# Patient Record
Sex: Male | Born: 1970 | Race: White | Hispanic: No | Marital: Married | Smoking: Never smoker
Health system: Southern US, Community
[De-identification: ages and names within clinical notes are randomized; demographics above are authoritative.]

## PROBLEM LIST (undated history)

## (undated) DIAGNOSIS — G709 Myoneural disorder, unspecified: Secondary | ICD-10-CM

## (undated) DIAGNOSIS — G4730 Sleep apnea, unspecified: Secondary | ICD-10-CM

## (undated) DIAGNOSIS — IMO0001 Reserved for inherently not codable concepts without codable children: Secondary | ICD-10-CM

## (undated) DIAGNOSIS — R519 Headache, unspecified: Secondary | ICD-10-CM

## (undated) DIAGNOSIS — E119 Type 2 diabetes mellitus without complications: Secondary | ICD-10-CM

## (undated) DIAGNOSIS — R51 Headache: Secondary | ICD-10-CM

## (undated) HISTORY — PX: NASAL SINUS SURGERY: SHX719

## (undated) HISTORY — PX: OTHER SURGICAL HISTORY: SHX169

---

## 2015-06-27 ENCOUNTER — Other Ambulatory Visit: Payer: Self-pay | Admitting: Otolaryngology

## 2015-06-27 DIAGNOSIS — G8929 Other chronic pain: Secondary | ICD-10-CM

## 2015-06-27 DIAGNOSIS — R51 Headache: Secondary | ICD-10-CM

## 2015-06-27 DIAGNOSIS — R0981 Nasal congestion: Secondary | ICD-10-CM

## 2015-07-02 ENCOUNTER — Other Ambulatory Visit: Payer: Self-pay | Admitting: Emergency Medicine

## 2015-07-02 ENCOUNTER — Ambulatory Visit
Admission: RE | Admit: 2015-07-02 | Discharge: 2015-07-02 | Disposition: A | Discharge status: Home / Self Care | Payer: BLUE CROSS/BLUE SHIELD | Source: Ambulatory Visit | Attending: Otolaryngology | Admitting: Otolaryngology

## 2015-07-02 DIAGNOSIS — R51 Headache: Secondary | ICD-10-CM

## 2015-07-02 DIAGNOSIS — G8929 Other chronic pain: Secondary | ICD-10-CM

## 2015-07-02 DIAGNOSIS — R0981 Nasal congestion: Secondary | ICD-10-CM

## 2015-07-02 DIAGNOSIS — R519 Headache, unspecified: Secondary | ICD-10-CM

## 2015-07-08 ENCOUNTER — Other Ambulatory Visit: Payer: Self-pay | Admitting: Neurology

## 2015-07-08 ENCOUNTER — Ambulatory Visit
Admission: RE | Admit: 2015-07-08 | Discharge: 2015-07-08 | Disposition: A | Discharge status: Home / Self Care | Payer: BLUE CROSS/BLUE SHIELD | Source: Ambulatory Visit | Attending: Neurology | Admitting: Neurology

## 2015-07-08 DIAGNOSIS — M542 Cervicalgia: Secondary | ICD-10-CM

## 2015-08-02 ENCOUNTER — Encounter (HOSPITAL_BASED_OUTPATIENT_CLINIC_OR_DEPARTMENT_OTHER): Payer: Self-pay | Admitting: *Deleted

## 2015-08-02 NOTE — Progress Notes (Signed)
Bring all medications. Coming next week for BMET and EKG

## 2015-08-06 ENCOUNTER — Encounter (HOSPITAL_BASED_OUTPATIENT_CLINIC_OR_DEPARTMENT_OTHER)
Admission: RE | Admit: 2015-08-06 | Discharge: 2015-08-06 | Disposition: A | Discharge status: Home / Self Care | Payer: BLUE CROSS/BLUE SHIELD | Source: Ambulatory Visit | Attending: Otolaryngology | Admitting: Otolaryngology

## 2015-08-06 ENCOUNTER — Other Ambulatory Visit: Payer: Self-pay

## 2015-08-06 ENCOUNTER — Other Ambulatory Visit: Payer: Self-pay | Admitting: Otolaryngology

## 2015-08-06 DIAGNOSIS — R040 Epistaxis: Secondary | ICD-10-CM | POA: Diagnosis not present

## 2015-08-06 DIAGNOSIS — Z7984 Long term (current) use of oral hypoglycemic drugs: Secondary | ICD-10-CM | POA: Diagnosis not present

## 2015-08-06 DIAGNOSIS — J338 Other polyp of sinus: Secondary | ICD-10-CM | POA: Diagnosis not present

## 2015-08-06 DIAGNOSIS — J3489 Other specified disorders of nose and nasal sinuses: Secondary | ICD-10-CM | POA: Diagnosis not present

## 2015-08-06 DIAGNOSIS — Z7982 Long term (current) use of aspirin: Secondary | ICD-10-CM | POA: Diagnosis not present

## 2015-08-06 DIAGNOSIS — J343 Hypertrophy of nasal turbinates: Secondary | ICD-10-CM | POA: Diagnosis not present

## 2015-08-06 DIAGNOSIS — E1142 Type 2 diabetes mellitus with diabetic polyneuropathy: Secondary | ICD-10-CM | POA: Diagnosis not present

## 2015-08-06 DIAGNOSIS — Z79899 Other long term (current) drug therapy: Secondary | ICD-10-CM | POA: Diagnosis not present

## 2015-08-06 DIAGNOSIS — J329 Chronic sinusitis, unspecified: Secondary | ICD-10-CM | POA: Diagnosis not present

## 2015-08-06 DIAGNOSIS — J342 Deviated nasal septum: Secondary | ICD-10-CM | POA: Diagnosis present

## 2015-08-06 LAB — BASIC METABOLIC PANEL
Anion gap: 9 (ref 5–15)
BUN: 21 mg/dL — ABNORMAL HIGH (ref 6–20)
CALCIUM: 9.5 mg/dL (ref 8.9–10.3)
CO2: 27 mmol/L (ref 22–32)
CREATININE: 0.83 mg/dL (ref 0.61–1.24)
Chloride: 105 mmol/L (ref 101–111)
GFR calc non Af Amer: >60 mL/min (ref >60)
Glucose, Bld: 120 mg/dL — ABNORMAL HIGH (ref 65–99)
Potassium: 4.5 mmol/L (ref 3.5–5.1)
Sodium: 141 mmol/L (ref 135–145)

## 2015-08-06 NOTE — H&P (Signed)
PREOPERATIVE H&P  Chief Complaint: trouble breathing and recurrent sinus infections  HPI: Brent Chan is a 45 y.o. male who presents for evaluation of trouble breathing through his nose and recurrent sinus infections. He had previous sinus surgery about 8 years ago in [place]. He still has a deviated septum and moderate sinus disease in the ethmoid regions on recent CT scan despite multiple rounds of antibiotics. He's taken to the OR for septoplasty, turbinate reductions. And FESS.  Past Medical History  Diagnosis Date  . Sleep apnea     minor according to pt  . Diabetes mellitus without complication (HCC)   . Headache   . Neuromuscular disorder (HCC)     both feet neuropathy  . 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 Nasal sinus surgery    . Compund fracture      of tibia and fibia - 15 years ago   Social History   Social History  . Marital Status: Married    Spouse Name: N/A  . Number of Children: N/A  . Years of Education: N/A   Social History Main Topics  . Smoking status: Never Smoker   . Smokeless tobacco: None  . Alcohol Use: Yes     Comment: Drinks once a month  . Drug Use: No  . Sexual Activity: Not Asked   Other Topics Concern  . None   Social History Narrative  . None   History reviewed. No pertinent family history. Allergies  Allergen Reactions  . Codeine Nausea And Vomiting  . Morphine And Related Nausea And Vomiting  . Vicodin [Hydrocodone-Acetaminophen] Nausea And Vomiting   Prior to Admission medications   Medication Sig Start Date End Date Taking? Authorizing Provider  aspirin 81 MG tablet Take 81 mg by mouth daily.   Yes Historical Provider, MD  azelastine (ASTELIN) 0.1 % nasal spray Place into both nostrils 2 (two) times daily. Use in each nostril as directed   Yes Historical Provider, MD  cholecalciferol (VITAMIN D) 1000 units tablet Take 1,000 Units by mouth daily.   Yes Historical  Provider, MD  Dextromethorphan-Guaifenesin [facility] DM PO) Take by mouth.   Yes Historical Provider, MD  etodolac (LODINE) 200 MG capsule Take 200 mg by mouth 2 (two) times daily.   Yes Historical Provider, MD  fluticasone (FLONASE) 50 MCG/ACT nasal spray Place into both nostrils daily.   Yes Historical Provider, MD  gabapentin (NEURONTIN) 300 MG capsule Take 300 mg by mouth as needed.   Yes Historical Provider, MD  metFORMIN (GLUCOPHAGE) 500 MG tablet Take 500 mg by mouth 2 (two) times daily with a meal.   Yes Historical Provider, MD  methocarbamol (ROBAXIN) 500 MG tablet Take 500 mg by mouth 4 (four) times daily.   Yes Historical Provider, MD  methylphenidate (METADATE CD) 40 MG CR capsule Take 40 mg by mouth every morning.   Yes Historical Provider, MD  Multiple Vitamin (MULTIVITAMIN) tablet Take 1 tablet by mouth daily.   Yes Historical Provider, MD  vitamin E 200 UNIT capsule Take 200 Units by mouth daily.   Yes Historical Provider, MD     Positive ROS: trouble breathing and bleeding on the right side  All other systems have been reviewed and were otherwise negative with the exception of those mentioned in the HPI and as above.  Physical Exam: There were no vitals filed for this visit.  General: Alert, no acute distress Oral:  Normal oral mucosa and tonsils Nasal: Septal deviation to the right with crusting on the right side of the septum. Large turbinates. No polyps Neck: No palpable adenopathy or thyroid nodules Ear: Ear canal is clear with normal appearing TMs Cardiovascular: Regular rate and rhythm, no murmur.  Respiratory: Clear to auscultation Neurologic: Alert and oriented x 3   Assessment/Plan: septal deviation,sinus disease,turbinate hypertrophy Plan for Procedure(s): BILATERAL FUNCTIONAL ENDOSCOPIC SINUS SURGERY  NASAL SEPTOPLASTY WITH BILATERAL TURBINATE REDUCTION   Dillard Cannon, MD 08/06/2015 6:04 PM

## 2015-08-09 ENCOUNTER — Ambulatory Visit (HOSPITAL_BASED_OUTPATIENT_CLINIC_OR_DEPARTMENT_OTHER): Payer: BLUE CROSS/BLUE SHIELD | Admitting: Anesthesiology

## 2015-08-09 ENCOUNTER — Encounter (HOSPITAL_BASED_OUTPATIENT_CLINIC_OR_DEPARTMENT_OTHER): Payer: Self-pay | Admitting: *Deleted

## 2015-08-09 ENCOUNTER — Ambulatory Visit (HOSPITAL_BASED_OUTPATIENT_CLINIC_OR_DEPARTMENT_OTHER)
Admission: RE | Admit: 2015-08-09 | Discharge: 2015-08-09 | Disposition: A | Discharge status: Home / Self Care | Payer: BLUE CROSS/BLUE SHIELD | Source: Ambulatory Visit | Attending: Otolaryngology | Admitting: Otolaryngology

## 2015-08-09 ENCOUNTER — Encounter (HOSPITAL_BASED_OUTPATIENT_CLINIC_OR_DEPARTMENT_OTHER)
Admission: RE | Disposition: A | Discharge status: Home / Self Care | Payer: Self-pay | Source: Ambulatory Visit | Attending: Otolaryngology

## 2015-08-09 DIAGNOSIS — J329 Chronic sinusitis, unspecified: Secondary | ICD-10-CM | POA: Insufficient documentation

## 2015-08-09 DIAGNOSIS — R040 Epistaxis: Secondary | ICD-10-CM | POA: Insufficient documentation

## 2015-08-09 DIAGNOSIS — Z7984 Long term (current) use of oral hypoglycemic drugs: Secondary | ICD-10-CM | POA: Insufficient documentation

## 2015-08-09 DIAGNOSIS — J343 Hypertrophy of nasal turbinates: Secondary | ICD-10-CM | POA: Insufficient documentation

## 2015-08-09 DIAGNOSIS — E1142 Type 2 diabetes mellitus with diabetic polyneuropathy: Secondary | ICD-10-CM | POA: Insufficient documentation

## 2015-08-09 DIAGNOSIS — J342 Deviated nasal septum: Secondary | ICD-10-CM | POA: Insufficient documentation

## 2015-08-09 DIAGNOSIS — J3489 Other specified disorders of nose and nasal sinuses: Secondary | ICD-10-CM | POA: Insufficient documentation

## 2015-08-09 DIAGNOSIS — Z79899 Other long term (current) drug therapy: Secondary | ICD-10-CM | POA: Insufficient documentation

## 2015-08-09 DIAGNOSIS — J338 Other polyp of sinus: Secondary | ICD-10-CM | POA: Insufficient documentation

## 2015-08-09 DIAGNOSIS — Z7982 Long term (current) use of aspirin: Secondary | ICD-10-CM | POA: Insufficient documentation

## 2015-08-09 HISTORY — PX: NASAL SEPTOPLASTY W/ TURBINOPLASTY: SHX2070

## 2015-08-09 HISTORY — DX: Headache: R51

## 2015-08-09 HISTORY — DX: Sleep apnea, unspecified: G47.30

## 2015-08-09 HISTORY — DX: Type 2 diabetes mellitus without complications: E11.9

## 2015-08-09 HISTORY — PX: SINUS ENDO WITH FUSION: SHX5329

## 2015-08-09 HISTORY — DX: Myoneural disorder, unspecified: G70.9

## 2015-08-09 HISTORY — DX: Reserved for inherently not codable concepts without codable children: IMO0001

## 2015-08-09 HISTORY — DX: Headache, unspecified: R51.9

## 2015-08-09 LAB — POCT HEMOGLOBIN-HEMACUE: Hemoglobin: 14.8 g/dL (ref 13.0–17.0)

## 2015-08-09 LAB — GLUCOSE, CAPILLARY
Glucose-Capillary: 101 mg/dL — ABNORMAL HIGH (ref 65–99)
Glucose-Capillary: 156 mg/dL — ABNORMAL HIGH (ref 65–99)

## 2015-08-09 SURGERY — SURGERY, PARANASAL SINUS, ENDOSCOPIC, WITH NASAL SEPTOPLASTY, TURBINOPLASTY, AND MAXILLARY SINUSOTOMY
Anesthesia: General | Laterality: Bilateral

## 2015-08-09 MED ORDER — OXYMETAZOLINE HCL 0.05 % NA SOLN
NASAL | Status: DC | PRN
  Administered 2015-08-09: 1 via TOPICAL

## 2015-08-09 MED ORDER — SODIUM CHLORIDE 0.9 % IV SOLN
INTRAVENOUS | Status: DC | PRN
  Administered 2015-08-09: 500 mL via INTRAMUSCULAR

## 2015-08-09 MED ORDER — SODIUM CHLORIDE 0.9 % IJ SOLN
INTRAMUSCULAR | Status: DC | PRN
  Administered 2015-08-09: 3 mL

## 2015-08-09 MED ORDER — SUGAMMADEX SODIUM 200 MG/2ML IV SOLN
INTRAVENOUS | Status: AC
  Filled 2015-08-09: qty 2

## 2015-08-09 MED ORDER — PROPOFOL 10 MG/ML IV BOLUS
INTRAVENOUS | Status: DC | PRN
  Administered 2015-08-09: 180 mg via INTRAVENOUS

## 2015-08-09 MED ORDER — MUPIROCIN 2 % EX OINT
TOPICAL_OINTMENT | CUTANEOUS | Status: DC | PRN
  Administered 2015-08-09: 1 via NASAL

## 2015-08-09 MED ORDER — LIDOCAINE HCL (CARDIAC) 20 MG/ML IV SOLN
INTRAVENOUS | Status: AC
  Filled 2015-08-09: qty 5

## 2015-08-09 MED ORDER — ARTIFICIAL TEARS OP OINT
TOPICAL_OINTMENT | OPHTHALMIC | Status: AC
  Filled 2015-08-09: qty 7

## 2015-08-09 MED ORDER — FENTANYL CITRATE (PF) 100 MCG/2ML IJ SOLN
INTRAMUSCULAR | Status: AC
  Filled 2015-08-09: qty 4

## 2015-08-09 MED ORDER — HYDROMORPHONE HCL 1 MG/ML IJ SOLN
INTRAMUSCULAR | Status: AC
  Filled 2015-08-09: qty 1

## 2015-08-09 MED ORDER — PHENYLEPHRINE HCL 10 MG/ML IJ SOLN
INTRAMUSCULAR | Status: DC | PRN
  Administered 2015-08-09: 80 ug via INTRAVENOUS

## 2015-08-09 MED ORDER — KETOROLAC TROMETHAMINE 30 MG/ML IJ SOLN
INTRAMUSCULAR | Status: DC | PRN
  Administered 2015-08-09: 30 mg via INTRAVENOUS

## 2015-08-09 MED ORDER — SUGAMMADEX SODIUM 200 MG/2ML IV SOLN
INTRAVENOUS | Status: DC | PRN
  Administered 2015-08-09: 200 mg via INTRAVENOUS

## 2015-08-09 MED ORDER — SCOPOLAMINE 1 MG/3DAYS TD PT72
MEDICATED_PATCH | TRANSDERMAL | Status: AC
  Filled 2015-08-09: qty 1

## 2015-08-09 MED ORDER — MIDAZOLAM HCL 2 MG/2ML IJ SOLN
INTRAMUSCULAR | Status: AC
Start: 2015-08-09 — End: 2015-08-09
  Filled 2015-08-09: qty 2

## 2015-08-09 MED ORDER — HYDROMORPHONE HCL 1 MG/ML IJ SOLN
0.2500 mg | INTRAMUSCULAR | Status: DC | PRN
  Administered 2015-08-09 (×2): 0.5 mg via INTRAVENOUS

## 2015-08-09 MED ORDER — DEXAMETHASONE SODIUM PHOSPHATE 10 MG/ML IJ SOLN
INTRAMUSCULAR | Status: AC
  Filled 2015-08-09: qty 1

## 2015-08-09 MED ORDER — HYDROCODONE-ACETAMINOPHEN 5-325 MG PO TABS: 1.0000 | ORAL_TABLET | Freq: Four times a day (QID) | ORAL | Status: AC | PRN

## 2015-08-09 MED ORDER — MUPIROCIN 2 % EX OINT
TOPICAL_OINTMENT | CUTANEOUS | Status: AC
  Filled 2015-08-09: qty 22

## 2015-08-09 MED ORDER — ONDANSETRON HCL 4 MG/2ML IJ SOLN
INTRAMUSCULAR | Status: AC
  Filled 2015-08-09: qty 2

## 2015-08-09 MED ORDER — ROCURONIUM BROMIDE 50 MG/5ML IV SOLN
INTRAVENOUS | Status: AC
  Filled 2015-08-09: qty 1

## 2015-08-09 MED ORDER — CEPHALEXIN 500 MG PO CAPS: 500.0000 mg | ORAL_CAPSULE | Freq: Two times a day (BID) | ORAL | Status: AC

## 2015-08-09 MED ORDER — ONDANSETRON HCL 4 MG/2ML IJ SOLN
INTRAMUSCULAR | Status: DC | PRN
  Administered 2015-08-09: 4 mg via INTRAVENOUS

## 2015-08-09 MED ORDER — FENTANYL CITRATE (PF) 100 MCG/2ML IJ SOLN
50.0000 ug | INTRAMUSCULAR | Status: DC | PRN
  Administered 2015-08-09: 100 ug via INTRAVENOUS

## 2015-08-09 MED ORDER — SUCCINYLCHOLINE CHLORIDE 20 MG/ML IJ SOLN
INTRAMUSCULAR | Status: AC
  Filled 2015-08-09: qty 1

## 2015-08-09 MED ORDER — LIDOCAINE-EPINEPHRINE 1 %-1:100000 IJ SOLN
INTRAMUSCULAR | Status: AC
  Filled 2015-08-09: qty 1

## 2015-08-09 MED ORDER — LIDOCAINE HCL (CARDIAC) 20 MG/ML IV SOLN
INTRAVENOUS | Status: DC | PRN
  Administered 2015-08-09: 100 mg via INTRAVENOUS

## 2015-08-09 MED ORDER — OXYMETAZOLINE HCL 0.05 % NA SOLN
NASAL | Status: AC
  Filled 2015-08-09: qty 15

## 2015-08-09 MED ORDER — ROCURONIUM BROMIDE 100 MG/10ML IV SOLN
INTRAVENOUS | Status: DC | PRN
  Administered 2015-08-09: 50 mg via INTRAVENOUS

## 2015-08-09 MED ORDER — LACTATED RINGERS IV SOLN
INTRAVENOUS | Status: DC
  Administered 2015-08-09: 07:00:00 via INTRAVENOUS

## 2015-08-09 MED ORDER — CEFAZOLIN SODIUM-DEXTROSE 2-3 GM-% IV SOLR
2.0000 g | INTRAVENOUS | Status: AC
  Administered 2015-08-09: 2 g via INTRAVENOUS

## 2015-08-09 MED ORDER — PROPOFOL 10 MG/ML IV BOLUS
INTRAVENOUS | Status: AC
  Filled 2015-08-09: qty 40

## 2015-08-09 MED ORDER — MIDAZOLAM HCL 2 MG/2ML IJ SOLN
1.0000 mg | INTRAMUSCULAR | Status: DC | PRN
  Administered 2015-08-09: 2 mg via INTRAVENOUS

## 2015-08-09 MED ORDER — GLYCOPYRROLATE 0.2 MG/ML IJ SOLN: 0.2000 mg | Freq: Once | INTRAMUSCULAR | Status: DC | PRN

## 2015-08-09 MED ORDER — PHENYLEPHRINE 40 MCG/ML (10ML) SYRINGE FOR IV PUSH (FOR BLOOD PRESSURE SUPPORT)
PREFILLED_SYRINGE | INTRAVENOUS | Status: AC
  Filled 2015-08-09: qty 10

## 2015-08-09 MED ORDER — LIDOCAINE-EPINEPHRINE 1 %-1:100000 IJ SOLN
INTRAMUSCULAR | Status: DC | PRN
  Administered 2015-08-09: 17 mL

## 2015-08-09 MED ORDER — CEFAZOLIN SODIUM-DEXTROSE 2-3 GM-% IV SOLR
INTRAVENOUS | Status: AC
  Filled 2015-08-09: qty 50

## 2015-08-09 MED ORDER — DEXAMETHASONE SODIUM PHOSPHATE 4 MG/ML IJ SOLN
INTRAMUSCULAR | Status: DC | PRN
  Administered 2015-08-09: 10 mg via INTRAVENOUS

## 2015-08-09 MED ORDER — ONDANSETRON HCL 4 MG/2ML IJ SOLN: 4.0000 mg | Freq: Once | INTRAMUSCULAR | Status: DC | PRN

## 2015-08-09 MED ORDER — MEPERIDINE HCL 25 MG/ML IJ SOLN: 6.2500 mg | INTRAMUSCULAR | Status: DC | PRN

## 2015-08-09 MED ORDER — SCOPOLAMINE 1 MG/3DAYS TD PT72
1.0000 | MEDICATED_PATCH | Freq: Once | TRANSDERMAL | Status: AC | PRN
  Administered 2015-08-09: 1 via TRANSDERMAL

## 2015-08-09 MED ORDER — KETOROLAC TROMETHAMINE 30 MG/ML IJ SOLN
INTRAMUSCULAR | Status: AC
  Filled 2015-08-09: qty 1

## 2015-08-09 MED ORDER — METHYLPREDNISOLONE ACETATE 80 MG/ML IJ SUSP
INTRAMUSCULAR | Status: AC
  Filled 2015-08-09: qty 1

## 2015-08-09 SURGICAL SUPPLY — 77 items
ATTRACTOMAT 16X20 MAGNETIC DRP (DRAPES) IMPLANT
BLADE INF TURB ROT M4 2 5PK (BLADE) ×2 IMPLANT
BLADE INF TURB ROT M4 2MM 5PK (BLADE) ×1
BLADE RAD60 ROTATE M4 4 5PK (BLADE) IMPLANT
BLADE RAD60 ROTATE M4 4MM 5PK (BLADE)
BLADE ROTATE RAD 12 4 M4 (BLADE) IMPLANT
BLADE ROTATE RAD 12 4MM M4 (BLADE)
BLADE ROTATE RAD 40 4 M4 (BLADE) IMPLANT
BLADE ROTATE RAD 40 4MM M4 (BLADE)
BLADE ROTATE TRICUT 4MX13CM M4 (BLADE) ×1
BLADE ROTATE TRICUT 4X13 M4 (BLADE) ×2 IMPLANT
BUR HS RAD FRONTAL 3 (BURR) IMPLANT
BUR HS RAD FRONTAL 3MM (BURR)
CANISTER SUC SOCK COL 7IN (MISCELLANEOUS) ×3 IMPLANT
CANISTER SUCT 1200ML W/VALVE (MISCELLANEOUS) ×3 IMPLANT
CLEANER CAUTERY TIP 5X5 PAD (MISCELLANEOUS) IMPLANT
COAGULATOR SUCT 8FR VV (MISCELLANEOUS) ×3 IMPLANT
COVER PROBE W GEL 5X96 (DRAPES) IMPLANT
DECANTER SPIKE VIAL GLASS SM (MISCELLANEOUS) IMPLANT
DEVICE INFLATION 20/61 (MISCELLANEOUS) IMPLANT
DRAPE SURG 17X23 STRL (DRAPES) ×3 IMPLANT
DRESSING NASAL KENNEDY 3.5X.9 (MISCELLANEOUS) IMPLANT
DRSG NASAL KENNEDY 3.5X.9 (MISCELLANEOUS)
DRSG NASAL KENNEDY LMNT 8CM (GAUZE/BANDAGES/DRESSINGS) IMPLANT
DRSG NASOPORE 8CM (GAUZE/BANDAGES/DRESSINGS) ×3 IMPLANT
DRSG TELFA 3X8 NADH (GAUZE/BANDAGES/DRESSINGS) ×3 IMPLANT
ELECT COATED BLADE 2.86 ST (ELECTRODE) IMPLANT
ELECT NEEDLE BLADE 2-5/6 (NEEDLE) ×3 IMPLANT
ELECT REM PT RETURN 9FT ADLT (ELECTROSURGICAL) ×3
ELECTRODE REM PT RTRN 9FT ADLT (ELECTROSURGICAL) ×1 IMPLANT
GLOVE BIOGEL PI IND STRL 7.0 (GLOVE) ×2 IMPLANT
GLOVE BIOGEL PI IND STRL 7.5 (GLOVE) ×2 IMPLANT
GLOVE BIOGEL PI INDICATOR 7.0 (GLOVE) ×4
GLOVE BIOGEL PI INDICATOR 7.5 (GLOVE) ×4
GLOVE ECLIPSE 6.5 STRL STRAW (GLOVE) ×3 IMPLANT
GLOVE SKINSENSE NS SZ7.5 (GLOVE) ×4
GLOVE SKINSENSE STRL SZ7.5 (GLOVE) ×2 IMPLANT
GLOVE SS BIOGEL STRL SZ 7.5 (GLOVE) ×1 IMPLANT
GLOVE SUPERSENSE BIOGEL SZ 7.5 (GLOVE) ×2
GOWN STRL REUS W/ TWL LRG LVL3 (GOWN DISPOSABLE) ×2 IMPLANT
GOWN STRL REUS W/ TWL XL LVL3 (GOWN DISPOSABLE) ×3 IMPLANT
GOWN STRL REUS W/TWL LRG LVL3 (GOWN DISPOSABLE) ×4
GOWN STRL REUS W/TWL XL LVL3 (GOWN DISPOSABLE) ×6
HEMOSTAT SURGICEL .5X2 ABSORB (HEMOSTASIS) IMPLANT
HEMOSTAT SURGICEL 2X14 (HEMOSTASIS) IMPLANT
IV NS 1000ML (IV SOLUTION)
IV NS 1000ML BAXH (IV SOLUTION) IMPLANT
IV NS 500ML (IV SOLUTION) ×4
IV NS 500ML BAXH (IV SOLUTION) ×2 IMPLANT
NEEDLE PRECISIONGLIDE 27X1.5 (NEEDLE) ×3 IMPLANT
NEEDLE SPNL 25GX3.5 QUINCKE BL (NEEDLE) IMPLANT
NS IRRIG 1000ML POUR BTL (IV SOLUTION) ×3 IMPLANT
PACK BASIN DAY SURGERY FS (CUSTOM PROCEDURE TRAY) ×3 IMPLANT
PACK ENT DAY SURGERY (CUSTOM PROCEDURE TRAY) ×3 IMPLANT
PAD CLEANER CAUTERY TIP 5X5 (MISCELLANEOUS)
PATTIES SURGICAL .5 X3 (DISPOSABLE) ×3 IMPLANT
PENCIL BUTTON HOLSTER BLD 10FT (ELECTRODE) IMPLANT
SHEET SILASTIC 8X6X.030 25-30 (MISCELLANEOUS) IMPLANT
SLEEVE SCD COMPRESS KNEE MED (MISCELLANEOUS) ×3 IMPLANT
SOLUTION ANTI FOG 6CC (MISCELLANEOUS) ×3 IMPLANT
SPONGE GAUZE 2X2 8PLY STER LF (GAUZE/BANDAGES/DRESSINGS) ×1
SPONGE GAUZE 2X2 8PLY STRL LF (GAUZE/BANDAGES/DRESSINGS) ×2 IMPLANT
SUT CHROMIC 4 0 PS 2 18 (SUTURE) ×3 IMPLANT
SUT ETHILON 3 0 PS 1 (SUTURE) ×3 IMPLANT
SUT SILK 2 0 FS (SUTURE) IMPLANT
SUT VIC AB 4-0 P-3 18XBRD (SUTURE) IMPLANT
SUT VIC AB 4-0 P3 18 (SUTURE)
SYR 3ML 18GX1 1/2 (SYRINGE) IMPLANT
SYSTEM RELIEVA LUMA ILLUM (SINUPLASTY) IMPLANT
TOWEL OR 17X24 6PK STRL BLUE (TOWEL DISPOSABLE) ×6 IMPLANT
TRACKER ENT INSTRUMENT (MISCELLANEOUS) ×3 IMPLANT
TRACKER ENT PATIENT (MISCELLANEOUS) ×3 IMPLANT
TRAY DSU PREP LF (CUSTOM PROCEDURE TRAY) ×3 IMPLANT
TUBE CONNECTING 20'X1/4 (TUBING) ×1
TUBE CONNECTING 20X1/4 (TUBING) ×2 IMPLANT
TUBING STRAIGHTSHOT EPS 5PK (TUBING) IMPLANT
YANKAUER SUCT BULB TIP NO VENT (SUCTIONS) ×3 IMPLANT

## 2015-08-09 NOTE — Discharge Instructions (Addendum)
Elevate HOB and app;y cool compress to nose to reduce bleeding and swelling Tylenol, motrin or hydrocodone tabs prn pain Return to Dr Allene PyoNewman's office tomorrow at 9 am to have nasal packing removed Keflex 500 mg twice per day for 10 days Stop ASA for 5 days  Call your surgeon if you experience:   1.  Fever over 101.0. 2.  Inability to urinate. 3.  Nausea and/or vomiting. 4.  Extreme swelling or bruising at the surgical site. 5.  Continued bleeding from the incision. 6.  Increased pain, redness or drainage from the incision. 7.  Problems related to your pain medication. 8.  Any problems and/or concerns.   Post Anesthesia Home Care Instructions  Activity: Get plenty of rest for the remainder of the day. A responsible adult should stay with you for 24 hours following the procedure.  For the next 24 hours, DO NOT: -Drive a car -Advertising copywriterperate machinery -Drink alcoholic beverages -Take any medication unless instructed by your physician -Make any legal decisions or sign important papers.  Meals: Start with liquid foods such as gelatin or soup. Progress to regular foods as tolerated. Avoid greasy, spicy, heavy foods. If nausea and/or vomiting occur, drink only clear liquids until the nausea and/or vomiting subsides. Call your physician if vomiting continues.  Special Instructions/Symptoms: Your throat may feel dry or sore from the anesthesia or the breathing tube placed in your throat during surgery. If this causes discomfort, gargle with warm salt water. The discomfort should disappear within 24 hours.  If you had a scopolamine patch placed behind your ear for the management of post- operative nausea and/or vomiting:  1. The medication in the patch is effective for 72 hours, after which it should be removed.  Wrap patch in a tissue and discard in the trash. Wash hands thoroughly with soap and water. 2. You may remove the patch earlier than 72 hours if you experience unpleasant side effects  which may include dry mouth, dizziness or visual disturbances. 3. Avoid touching the patch. Wash your hands with soap and water after contact with the patch.

## 2015-08-09 NOTE — Op Note (Signed)
NAMLimmie Patricia:  Rieth, Hamed                 ACCOUNT NO.:  [id number]  MEDICAL RECORD NO.:  [id number]  LOCATION:                                 FACILITY:  PHYSICIAN:  Kristine GarbeChristopher E. Ezzard StandingNewman, M.D.DATE OF BIRTH:  10-17-1970  DATE OF PROCEDURE: DATE OF DISCHARGE:                              OPERATIVE REPORT   PREOPERATIVE DIAGNOSES:  Septal deviation with recurrent right-sided epistaxis.  Bilateral turbinate hypertrophy with nasal obstruction. Sinonasal polyps.  POSTOPERATIVE DIAGNOSES:  Septal deviation with recurrent right-sided epistaxis.  Bilateral turbinate hypertrophy with nasal obstruction. Sinonasal polyps.  OPERATION PERFORMED:  Septoplasty with bilateral inferior turbinate reductions.  Functional endoscopic sinus surgery with bilateral total ethmoidectomies.  SURGEON:  Kristine GarbeChristopher E. Ezzard StandingNewman, M.D.  ANESTHESIA:  General endotracheal.  COMPLICATIONS:  None.  BRIEF CLINICAL NOTE:  Michail Sermonodd Bambrick is a 45 year old gentleman who has had long history of chronic sinus problems.  He apparently had previous surgery about 7 or 8 years ago and no problem.  At that time, he had multiple sinonasal polyps with acute and chronic frontal sinusitis. Ever since his surgery, he has been using Afrin on a fairly regular basis and uses it on a daily basis 2 or 3 times a day.  On examination, he has rather significant septal deviation to the right.  In addition, he has a large sore on the septum on the right side that extends into the cartilage.  He has been having most of his bleeding related to this chronic sore, which is overlying the deviated portion of the septum at the junction of the cartilaginous septum and the maxillary crest.  He had a CT scan, which shows moderate ethmoid disease and little bit of the maxillary disease.  The frontal and sphenoid sinuses were actually fairly clear.  His main trouble seems to be breathing especially on the right side with frequent use of Afrin now.  He has  also had recurrent right-sided epistaxis.  He was taken to the operating room at this time for septoplasty and turbinate reductions to help improve his nasal breathing and functional endoscopic sinus surgery, removing any residual polypoid tissue from the ethmoid area.  DESCRIPTION OF PROCEDURE:  After adequate endotracheal anesthesia, the patient received 2 g of Ancef and 10 mg of Decadron IV preoperatively. The fusion machine was calibrated.  First, septoplasty was performed. Hemitransfixion incision was made along the cartilaginous septum on the right side.  Mucoperiosteal flaps were elevated posteriorly.  The patient had a bowel approximately 2 cm ulcer of the mucoperichondrium on the right side and actually extended into the cartilage. Mucoperichondrium was elevated around this ulcerative hole on the mucoperichondrium.  In the same region, some cartilage as well as the bony maxillary crest protruded into the right airway.  Mucoperichondrium and mucoperiosteal flaps were elevated in either of this and this was removed in order to return the septum more towards the midline.  In addition, mucoperichondrium and mucoperiosteal flaps were elevated posteriorly superiorly, some of the thickened cartilage in the posterior edge of the cartilaginous septum.  The area of the bone marrow was removed.  This completed the septoplasty portion of the procedure. Next, using the endoscopes and a microdebrider,  bilateral ethmoidectomies were performed.  At the end of previous ethmoid surgery, there was still some polypoid disease, the right side little bit worse than left.  This was removed with a microdebrider.  Few of the posterior ethmoid cells were still intact and these were opened up, and debris were removed with microdebrider.  On endoscopic evaluation of the maxillary ostia, these were still widely patent with minimal disease within the maxillary sinus, may be a little bit of thickened  mucous membranes, but nothing was really done to the maxillary ostia that already opened up.  Nothing needs to be done to the sphenoid area and frontal areas, these were clear on the CT scan with minimal disease in the nasofrontal duct area.  This completed the bilateral ethmoidectomies.  Next, the inferior turbinate reductions were performed using the Medtronic turbinate blade on the right side and on the left side.  The Medtronic turbinate blade was used, but in addition, some of the bony turbinate tissue was removed with scissors as the left turbinate was larger than the right because of the deviation of the septum to the right.  After performing turbinate reductions, the remaining turbinate tissue was outfractured.  Splints were secured to either side of the septum with a 3-0 nylon suture.  The middle meatus and ethmoid area were packed with NasoPore, soaked in Bactroban, and the floor of the nose and turbinate area were packed with Telfa, soaked in Bactroban.  This completed the procedure.  Britt was awoken from anesthesia and transferred to the recovery room postoperatively doing well.  DISPOSITION:  Caven was discharged home later this morning on Keflex 500 mg b.i.d. for 10 days along with Tylenol, Motrin or hydrocodone 5 mg tablets 1-2 q.6 hours p.r.n. pain.  We will have him return to my office tomorrow morning at 9 to have his nasal packs removed and will follow up in my office in 10 days to have his splints removed.          ______________________________ Kristine Garbe Ezzard Standing, M.D.     CEN/MEDQ  D:  08/09/2015  T:  08/09/2015  Job:  [id number]  cc:   Evelena Peat, M.D.

## 2015-08-09 NOTE — Interval H&P Note (Signed)
History and Physical Interval Note:  08/09/2015 7:30 AM  Brent Chan  has presented today for surgery, with the diagnosis of septal deviation,sinus disease,turbinate hypertrophy  The various methods of treatment have been discussed with the patient and family. After consideration of risks, benefits and other options for treatment, the patient has consented to  Procedure(s): BILATERAL FUNCTIONAL ENDOSCOPIC SINUS SURGERY  (Bilateral) NASAL SEPTOPLASTY WITH BILATERAL TURBINATE REDUCTION (Bilateral) as a surgical intervention .  The patient's history has been reviewed, patient examined, no change in status, stable for surgery.  I have reviewed the patient's chart and labs.  Questions were answered to the patient's satisfaction.     CHRISTOPHER NEWMAN

## 2015-08-09 NOTE — Transfer of Care (Signed)
Immediate Anesthesia Transfer of Care Note  Patient: Brent Chan  Procedure(s) Performed: Procedure(s): BILATERAL FUNCTIONAL ENDOSCOPIC SINUS SURGERY WITH FUSION NAVIGATION, BILATERAL ETHMOIDECTOMY (Bilateral) NASAL SEPTOPLASTY WITH BILATERAL TURBINATE REDUCTION (Bilateral)  Patient Location: PACU  Anesthesia Type:General  Level of Consciousness: awake, alert , oriented and patient cooperative  Airway & Oxygen Therapy: Patient Spontanous Breathing and Patient connected to face mask oxygen  Post-op Assessment: Report given to RN and Post -op Vital signs reviewed and stable  Post vital signs: Reviewed and stable  BP/106/69   93-10 SaO2 100%  Last Vitals:  Filed Vitals:   08/09/15 0637  BP: 101/67  Pulse: 76  Temp: 36.7 C  Resp: 16    Complications: No apparent anesthesia complications

## 2015-08-09 NOTE — Brief Op Note (Signed)
08/09/2015  10:10 AM  PATIENT:  Brent Chan  45 y.o. male  PRE-OPERATIVE DIAGNOSIS:  septal deviation,sinus disease,turbinate hypertrophy  POST-OPERATIVE DIAGNOSIS:  septal deviation,sinus disease,turbinate hypertrophy  PROCEDURE:  Procedure(s): BILATERAL FUNCTIONAL ENDOSCOPIC SINUS SURGERY WITH FUSION NAVIGATION, BILATERAL ETHMOIDECTOMY (Bilateral) NASAL SEPTOPLASTY WITH BILATERAL TURBINATE REDUCTION (Bilateral)  SURGEON:  Surgeon(s) and Role:    * Drema Halonhristopher E Newman, MD - Primary  PHYSICIAN ASSISTANT:   ASSISTANTS: none   ANESTHESIA:   general  EBL:  Total I/O In: 800 [I.V.:800] Out: 60 [Blood:60]  BLOOD ADMINISTERED:none  DRAINS: none   LOCAL MEDICATIONS USED:  XYLOCAINE with EPI 14 cc  SPECIMEN:  No Specimen  DISPOSITION OF SPECIMEN:  N/A  COUNTS:  YES  TOURNIQUET:  * No tourniquets in log *  DICTATION: .Other Dictation: Dictation Number [id number]  PLAN OF CARE: Discharge to home after PACU  PATIENT DISPOSITION:  PACU - hemodynamically stable.   Delay start of Pharmacological VTE agent (>24hrs) due to surgical blood loss or risk of bleeding: yes

## 2015-08-09 NOTE — Anesthesia Procedure Notes (Signed)
Procedure Name: Intubation Date/Time: 08/09/2015 7:50 AM Performed by: Tyrone NineSAUVE, Alquan Morrish F Pre-anesthesia Checklist: Patient identified, Timeout performed, Emergency Drugs available, Suction available and Patient being monitored Patient Re-evaluated:Patient Re-evaluated prior to inductionOxygen Delivery Method: Circle system utilized Preoxygenation: Pre-oxygenation with 100% oxygen Intubation Type: IV induction Ventilation: Mask ventilation without difficulty Laryngoscope Size: Mac and 3 Grade View: Grade I Tube type: Oral Rae Tube size: 7.0 mm Number of attempts: 1 Airway Equipment and Method: Stylet Placement Confirmation: ETT inserted through vocal cords under direct vision,  positive ETCO2 and breath sounds checked- equal and bilateral Secured at: 19 cm Tube secured with: Tape Dental Injury: Teeth and Oropharynx as per pre-operative assessment

## 2015-08-09 NOTE — Anesthesia Preprocedure Evaluation (Addendum)
Anesthesia Evaluation  Patient identified by MRN, date of birth, ID band Patient awake    Reviewed: Allergy & Precautions, NPO status , Patient's Chart, lab work & pertinent test results  Airway Mallampati: I  TM Distance: >3 FB Neck ROM: Full    Dental  (+) Teeth Intact, Dental Advisory Given, Caps,    Pulmonary sleep apnea ,    Pulmonary exam normal        Cardiovascular Exercise Tolerance: Good negative cardio ROS Normal cardiovascular exam Rhythm:Regular Rate:Normal  06-Aug-2015 09:26:09 EKG Normal sinus rhythm   Neuro/Psych  Headaches, negative psych ROS   GI/Hepatic negative GI ROS, Neg liver ROS,   Endo/Other  diabetes, Type 2, Oral Hypoglycemic AgentsNeuropathy in feet  Renal/GU negative Renal ROS  negative genitourinary   Musculoskeletal negative musculoskeletal ROS (+)   Abdominal   Peds  Hematology   Anesthesia Other Findings   Reproductive/Obstetrics negative OB ROS                         Anesthesia Physical Anesthesia Plan  ASA: III  Anesthesia Plan: General   Post-op Pain Management:    Induction: Intravenous  Airway Management Planned: Oral ETT  Additional Equipment:   Intra-op Plan:   Post-operative Plan: Extubation in OR  Informed Consent: I have reviewed the patients History and Physical, chart, labs and discussed the procedure including the risks, benefits and alternatives for the proposed anesthesia with the patient or authorized representative who has indicated his/her understanding and acceptance.   Dental advisory given  Plan Discussed with: CRNA, Surgeon and Anesthesiologist  Anesthesia Plan Comments:        Anesthesia Quick Evaluation

## 2015-08-09 NOTE — Anesthesia Postprocedure Evaluation (Signed)
Anesthesia Post Note  Patient: Brent Chan  Procedure(s) Performed: Procedure(s) (LRB): BILATERAL FUNCTIONAL ENDOSCOPIC SINUS SURGERY WITH FUSION NAVIGATION, BILATERAL ETHMOIDECTOMY (Bilateral) NASAL SEPTOPLASTY WITH BILATERAL TURBINATE REDUCTION (Bilateral)  Patient location during evaluation: PACU Anesthesia Type: General Level of consciousness: awake and alert Pain management: pain level controlled Vital Signs Assessment: post-procedure vital signs reviewed and stable Respiratory status: spontaneous breathing, nonlabored ventilation, respiratory function stable and patient connected to nasal cannula oxygen Cardiovascular status: blood pressure returned to baseline and stable Postop Assessment: no signs of nausea or vomiting Anesthetic complications: no    Last Vitals:  Filed Vitals:   08/09/15 1141 08/09/15 1205  BP: 111/69 106/72  Pulse: 79 80  Temp:  36.6 C  Resp: 11 16    Last Pain:  Filed Vitals:   08/09/15 1246  PainSc: 2                  Magen Suriano DAVID

## 2015-08-12 ENCOUNTER — Encounter (HOSPITAL_BASED_OUTPATIENT_CLINIC_OR_DEPARTMENT_OTHER): Payer: Self-pay | Admitting: Otolaryngology

## 2017-06-15 IMAGING — CT CT MAXILLOFACIAL W/O CM
3 series · 15 of 47 positions shown, 18 images · non-contrast
Comparison: None.

CLINICAL DATA: 44-year-old male with headaches, nasal and sinus
congestion. Initial encounter.

EXAM:
CT MAXILLOFACIAL WITHOUT CONTRAST
TECHNIQUE: Multidetector CT imaging of the maxillofacial structures was
performed. Multiplanar CT image reconstructions were also generated.
A small metallic BB was placed on the right temple in order to
reliably differentiate right from left.

[Series 3: soft tissue · axial · 0.37mm/px · z∈[+98,+242]mm · 9 of 167 slices shown, 12 images]
[im 12/167  brain]
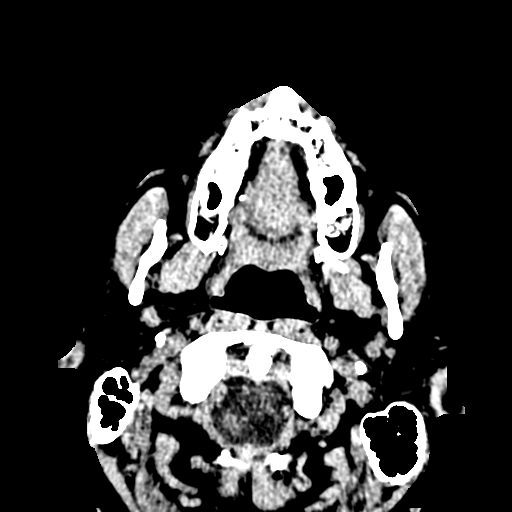
[im 12/167  bone]
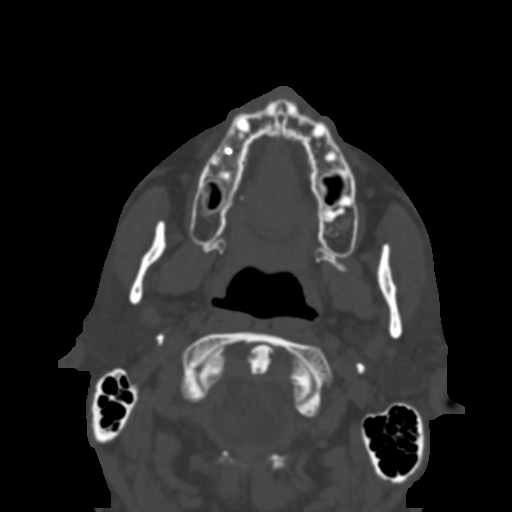
[im 29/167  bone]
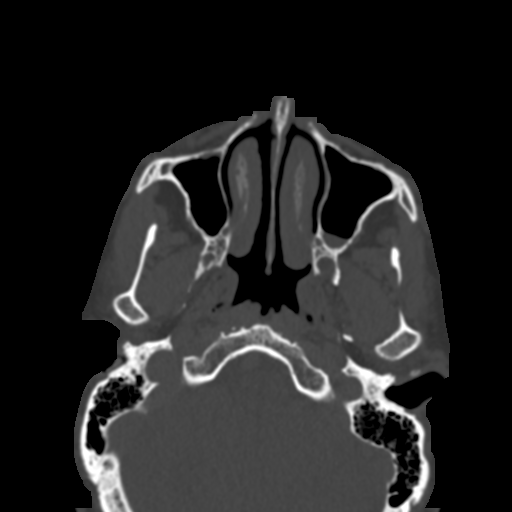
[im 46/167  bone]
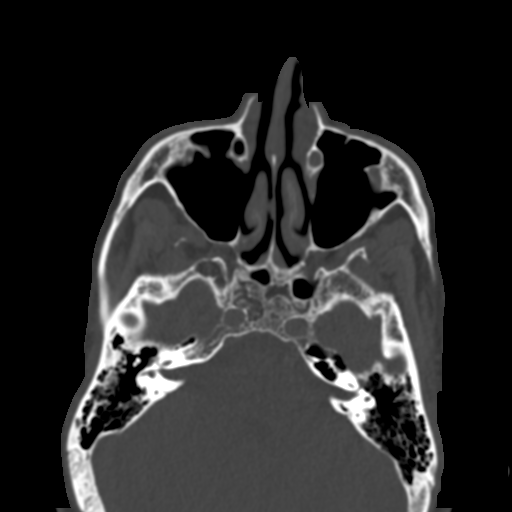
[im 63/167  bone]
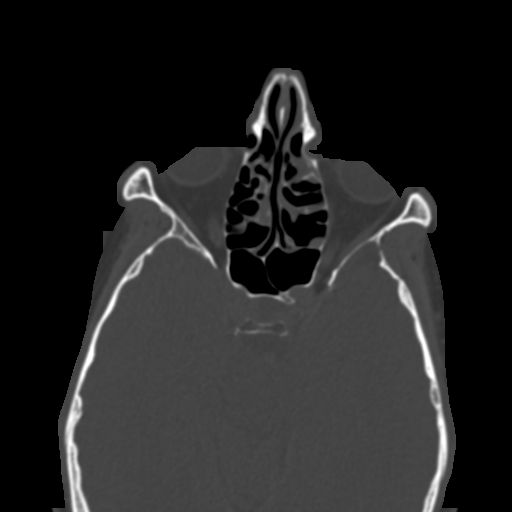
[im 86/167  brain]
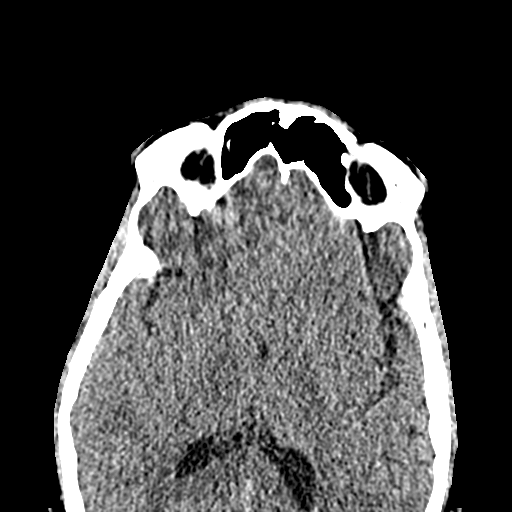
[im 86/167  bone]
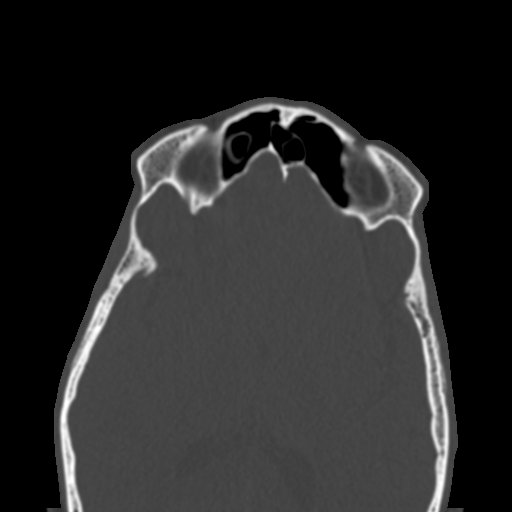
[im 104/167  bone]
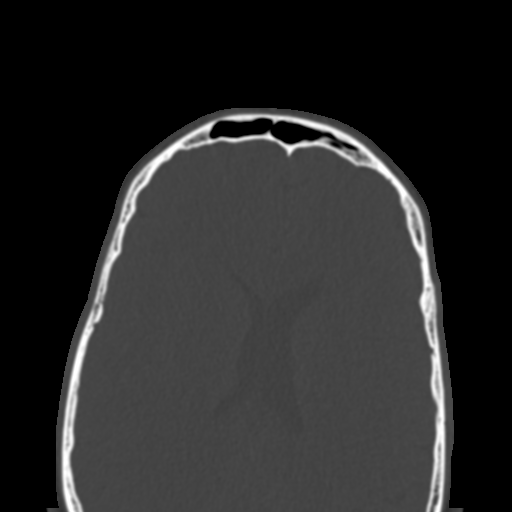
[im 121/167  bone]
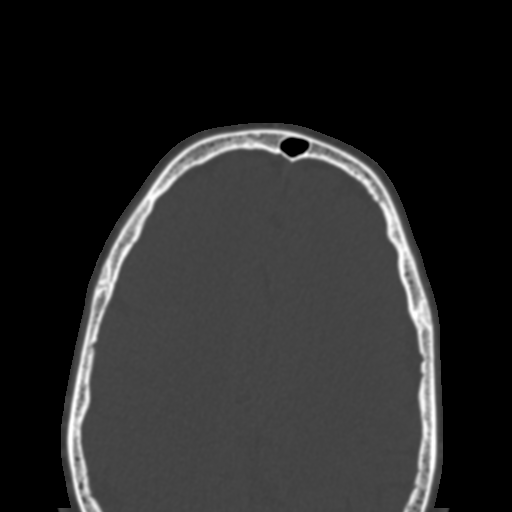
[im 138/167  bone]
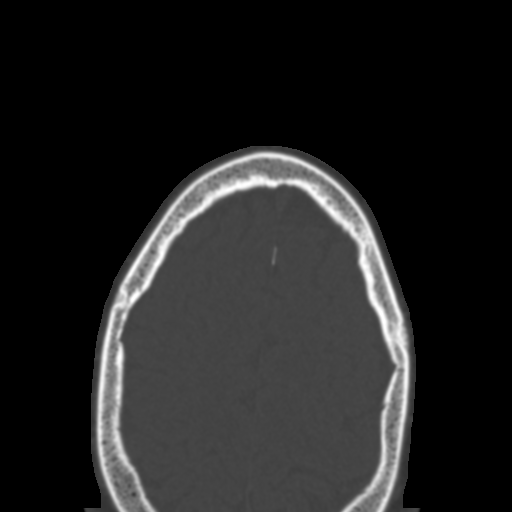
[im 155/167  brain]
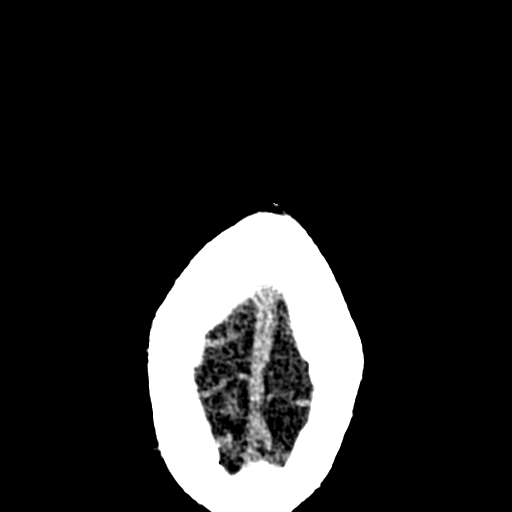
[im 155/167  bone]
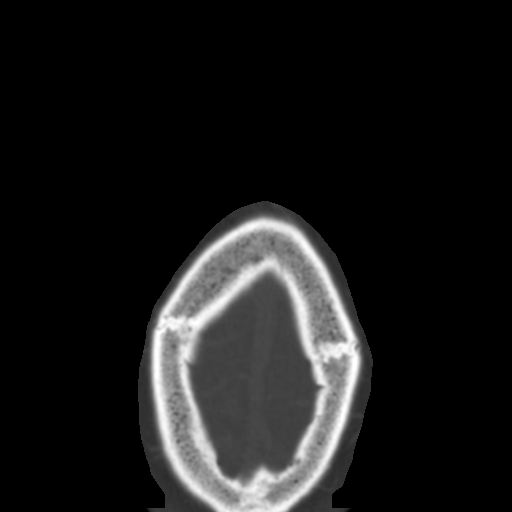

[Series 6: cor soft · coronal · 0.33mm/px · 3 of 96 slices shown]
[im 32/96  bone]
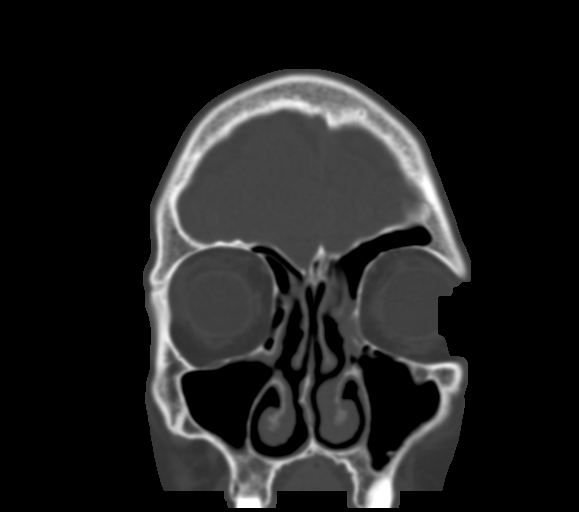
[im 43/96  bone]
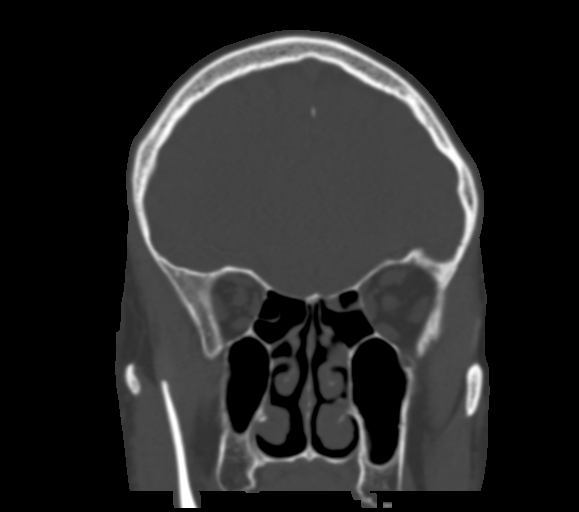
[im 53/96  bone]
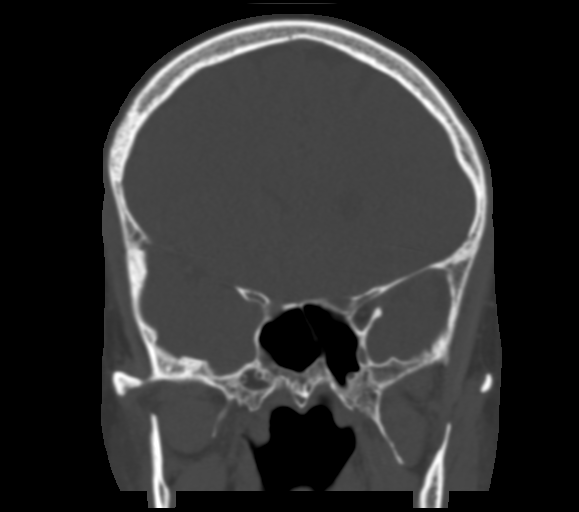

[Series 7: sag soft · sagittal · 0.33mm/px · 3 of 92 slices shown]
[im 31/92  bone]
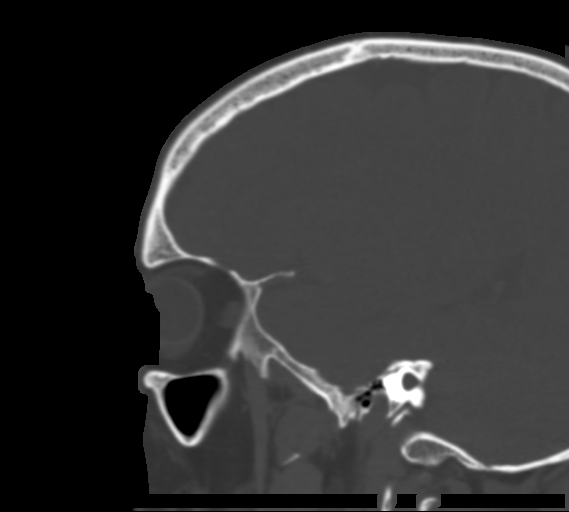
[im 46/92  bone]
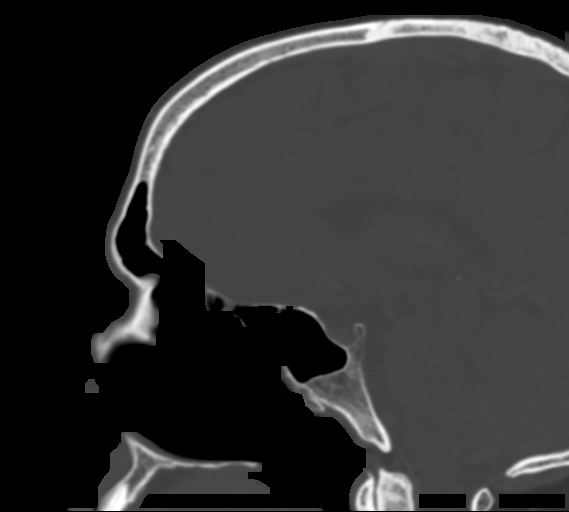
[im 61/92  bone]
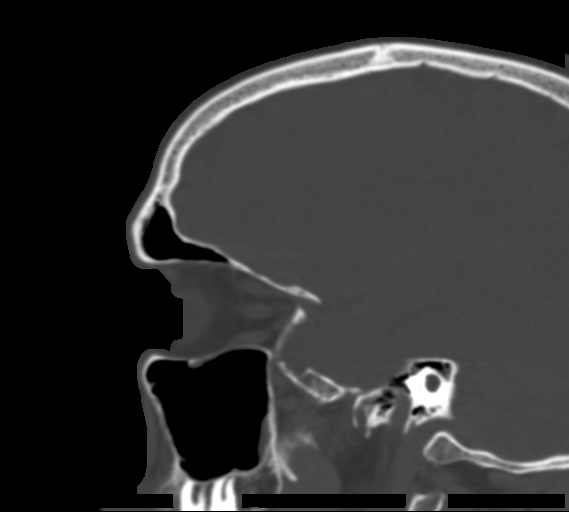

[15 of 47 positions shown; findings below may reference images not displayed]

FINDINGS: Negative noncontrast brain parenchyma. Mild calcified
atherosclerosis at the skull base. Dominant distal right vertebral
artery. Visualized orbit soft tissues are within normal limits.
Visualized scalp soft tissues are within normal limits. Negative
visualized noncontrast deep soft tissue spaces of the face.

Bilateral tympanic cavities and mastoids are clear.

Both sphenoid sinuses are clear, the left is mildly hyperplastic,
with pneumatization a row on the left vidian canal (series 4, image
56).

Scattered moderate bilateral ethmoid sinus mucosal thickening.

Both frontal sinuses are clear and mildly hyperplastic.

Previous bilateral maxillary antrostomies greater on the right. Mild
to moderate residual mucosal thickening along both maxillary antra
(coronal image 35). Mild superimposed bilateral maxillary alveolar
recess mucosal thickening.

Rightward nasal septal deviation. No acute osseous abnormality
identified.
IMPRESSION: 1. Previous maxillary antrostomies with mild to moderate residual
mucosal thickening along both maxillary antra, greater on the left.
2. Widespread moderate ethmoid sinus mucosal thickening.
3. Hyperplastic frontal and sphenoid sinuses are clear.
